# Patient Record
Sex: Male | Born: 1995 | Race: Black or African American | Hispanic: No | Marital: Single | Smoking: Never smoker
Health system: Southern US, Community
[De-identification: ages and names within clinical notes are randomized; demographics above are authoritative.]

---

## 2018-12-25 ENCOUNTER — Other Ambulatory Visit: Payer: Self-pay

## 2018-12-25 ENCOUNTER — Ambulatory Visit (INDEPENDENT_AMBULATORY_CARE_PROVIDER_SITE_OTHER): Payer: Self-pay

## 2018-12-25 ENCOUNTER — Ambulatory Visit (HOSPITAL_COMMUNITY)
Admission: EM | Admit: 2018-12-25 | Discharge: 2018-12-25 | Disposition: A | Discharge status: Home / Self Care | Payer: Self-pay | Attending: Family Medicine | Admitting: Family Medicine

## 2018-12-25 ENCOUNTER — Encounter (HOSPITAL_COMMUNITY): Payer: Self-pay

## 2018-12-25 DIAGNOSIS — S41111A Laceration without foreign body of right upper arm, initial encounter: Secondary | ICD-10-CM

## 2018-12-25 NOTE — ED Triage Notes (Signed)
Pt presents to UC w/ c/o right elbow and forearm laceration. Pt states he was exercising this morning and he hit some glass off the wall and cut his elbow at 0817 this morning.

## 2018-12-25 NOTE — ED Provider Notes (Addendum)
[place]    CSN: [id number] Arrival date & time: 12/25/18  0900      History   Chief Complaint Chief Complaint  Patient presents with  . right elbow laceration    HPI Joel Lozano is a 23 y.o. male.   Patient is a 23 year old male who presents today for right elbow laceration.  Reports that he was exercising this morning hitting glass that was on the wall.  Reporting that he cut his elbow approximately o'clock this morning.  Symptoms have been constant.  There is some slight bleeding.  Unsure if there is still glass in the wound.  Tetanus is not up-to-date per patient but he is refusing a tetanus.  No numbness, tingling or decreased range of motion of the extremity.  ROS per HPI      History reviewed. No pertinent past medical history.  There are no active problems to display for this patient.   History reviewed. No pertinent surgical history.     Home Medications    Prior to Admission medications   Not on File    Family History History reviewed. No pertinent family history.  Social History Social History   Tobacco Use  . Smoking status: Never Smoker  . Smokeless tobacco: Never Used  Substance Use Topics  . Alcohol use: Never    Frequency: Never  . Drug use: Never     Allergies   Patient has no known allergies.   Review of Systems Review of Systems   Physical Exam Triage Vital Signs ED Triage Vitals  Enc Vitals Group     BP 12/25/18 0920 130/82     Pulse Rate 12/25/18 0920 68     Resp 12/25/18 0920 16     Temp 12/25/18 0920 98.1 F (36.7 C)     Temp Source 12/25/18 0920 Temporal     SpO2 12/25/18 0920 99 %     Weight --      Height --      Head Circumference --      Peak Flow --      Pain Score 12/25/18 0933 0     Pain Loc --      Pain Edu? --      Excl. in [place]? --    No data found.  Updated Vital Signs BP 130/82 (BP Location: Left Arm)   Pulse 68   Temp 98.1 F (36.7 C) (Temporal)   Resp 16   SpO2 99%   Visual Acuity Right Eye Distance:   Left Eye Distance:   Bilateral Distance:    Right Eye Near:   Left Eye Near:    Bilateral Near:     Physical Exam Vitals signs and nursing note reviewed.  Constitutional:      Appearance: Normal appearance.  HENT:     Head: Normocephalic and atraumatic.  Eyes:     Conjunctiva/sclera: Conjunctivae normal.  Neck:     Musculoskeletal: Normal range of motion.  Pulmonary:     Effort: Pulmonary effort is normal.  Musculoskeletal: Normal range of motion.  Skin:    General: Skin is warm and dry.     Comments: See picture   Neurological:     Mental Status: He is alert.  Psychiatric:     Comments: Very flat affect but peasant.         UC Treatments / Results  Labs (all labs ordered are listed, but only abnormal results are displayed) Labs Reviewed - No data to display  EKG   Radiology Dg Elbow Complete Right  Result Date: 12/25/2018 CLINICAL DATA:  Pain following fall EXAM: RIGHT ELBOW - COMPLETE 3+ VIEW COMPARISON:  None. FINDINGS: Frontal, lateral, and bilateral oblique views were obtained. There are soft tissue injuries overlying the proximal forearm with air in these areas. There is no acute fracture or dislocation. No joint effusion. Joint spaces appear unremarkable. No erosive change. IMPRESSION: Areas of soft tissue injury with air in these areas. No radiopaque foreign body. No fracture or dislocation. No appreciable arthropathic change. Electronically Signed   By: Bretta Bang III M.D.   On: 12/25/2018 10:10    Procedures Laceration Repair  Date/Time: 12/25/2018 11:11 AM Performed by: Janace Aris, NP Authorized by: Janace Aris, NP   Consent:    Consent obtained:  Verbal   Consent given by:  Patient   Risks discussed:  Infection, need for additional repair, pain, poor cosmetic result and poor wound healing   Alternatives discussed:  No treatment and delayed treatment Universal protocol:    Patient identity  confirmed:  Verbally with patient Anesthesia (see MAR for exact dosages):    Anesthesia method:  Local infiltration   Local anesthetic:  Lidocaine 2% WITH epi Laceration details:    Location:  Shoulder/arm   Shoulder/arm location:  R elbow Repair type:    Repair type:  Complex Pre-procedure details:    Preparation:  Patient was prepped and draped in usual sterile fashion Exploration:    Limited defect created (wound extended): no     Hemostasis achieved with:  Direct pressure and epinephrine   Wound exploration: wound explored through full range of motion     Contaminated: no   Treatment:    Area cleansed with:  Saline   Amount of cleaning:  Standard   Visualized foreign bodies/material removed: no     Debridement:  None   Undermining:  None   Scar revision: no   Skin repair:    Repair method:  Sutures   Suture size:  4-0   Suture material:  Nylon   Suture technique:  Simple interrupted   Number of sutures:  8 Approximation:    Approximation:  Close Post-procedure details:    Dressing:  Open (no dressing)   Patient tolerance of procedure:  Tolerated well, no immediate complications   (including critical care time)  Medications Ordered in UC Medications - No data to display  Initial Impression / Assessment and Plan / UC Course  I have reviewed the triage vital signs and the nursing notes.  Pertinent labs & imaging results that were available during my care of the patient were reviewed by me and considered in my medical decision making (see chart for details).     2 lacerations repaired to right elbow and forearm. Patient tolerated well. Clean well before suturing and placed bacitracin on wound post suture. Recommended follow-up in 10 to 14 days for removal Infection precautions given. Pt understanding and agreed  Final Clinical Impressions(s) / UC Diagnoses   Final diagnoses:  Laceration of right upper extremity, initial encounter     Discharge Instructions      Nice to meet you  Keep the area clean and dry.  Watch for signs of infection Come back in 10 to 14 days for suture removal.      ED Prescriptions    None     PDMP not reviewed this encounter.   Janace Aris, NP 12/25/18 1110    Janace Aris, NP  12/25/18 1113  

## 2018-12-25 NOTE — Discharge Instructions (Addendum)
Nice to meet you  Keep the area clean and dry.  Watch for signs of infection Come back in 10 to 14 days for suture removal.

## 2019-01-06 ENCOUNTER — Ambulatory Visit (INDEPENDENT_AMBULATORY_CARE_PROVIDER_SITE_OTHER): Payer: Self-pay | Admitting: Primary Care

## 2020-03-19 IMAGING — DX DG ELBOW COMPLETE 3+V*R*
4 series · 4 of 4 positions shown · non-contrast
Comparison: None.

CLINICAL DATA: Pain following fall

EXAM:
RIGHT ELBOW - COMPLETE 3+ VIEW

[elbow ap]
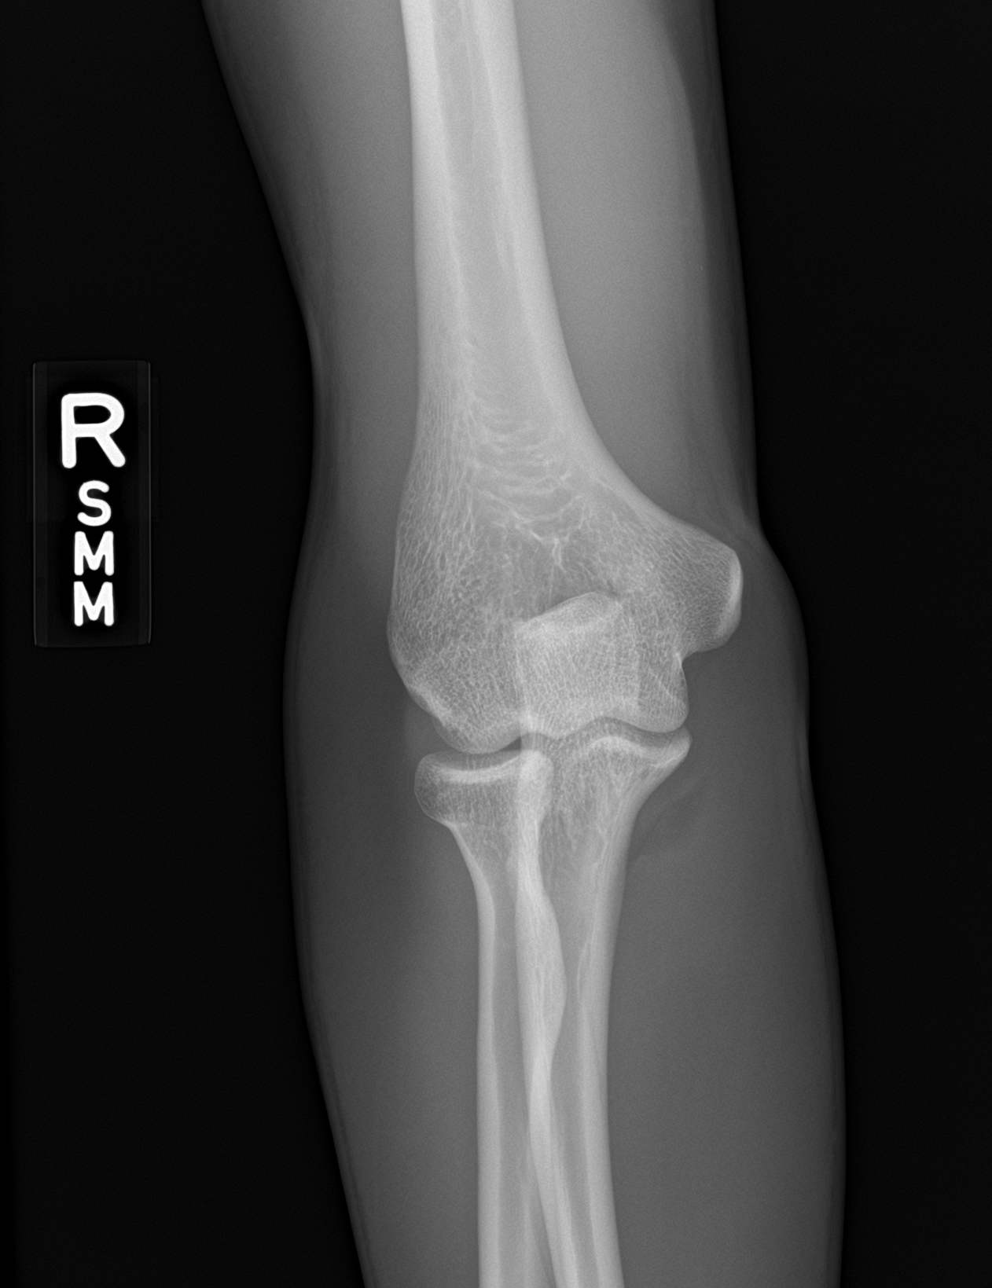

[elbow obl (1 of 2)]
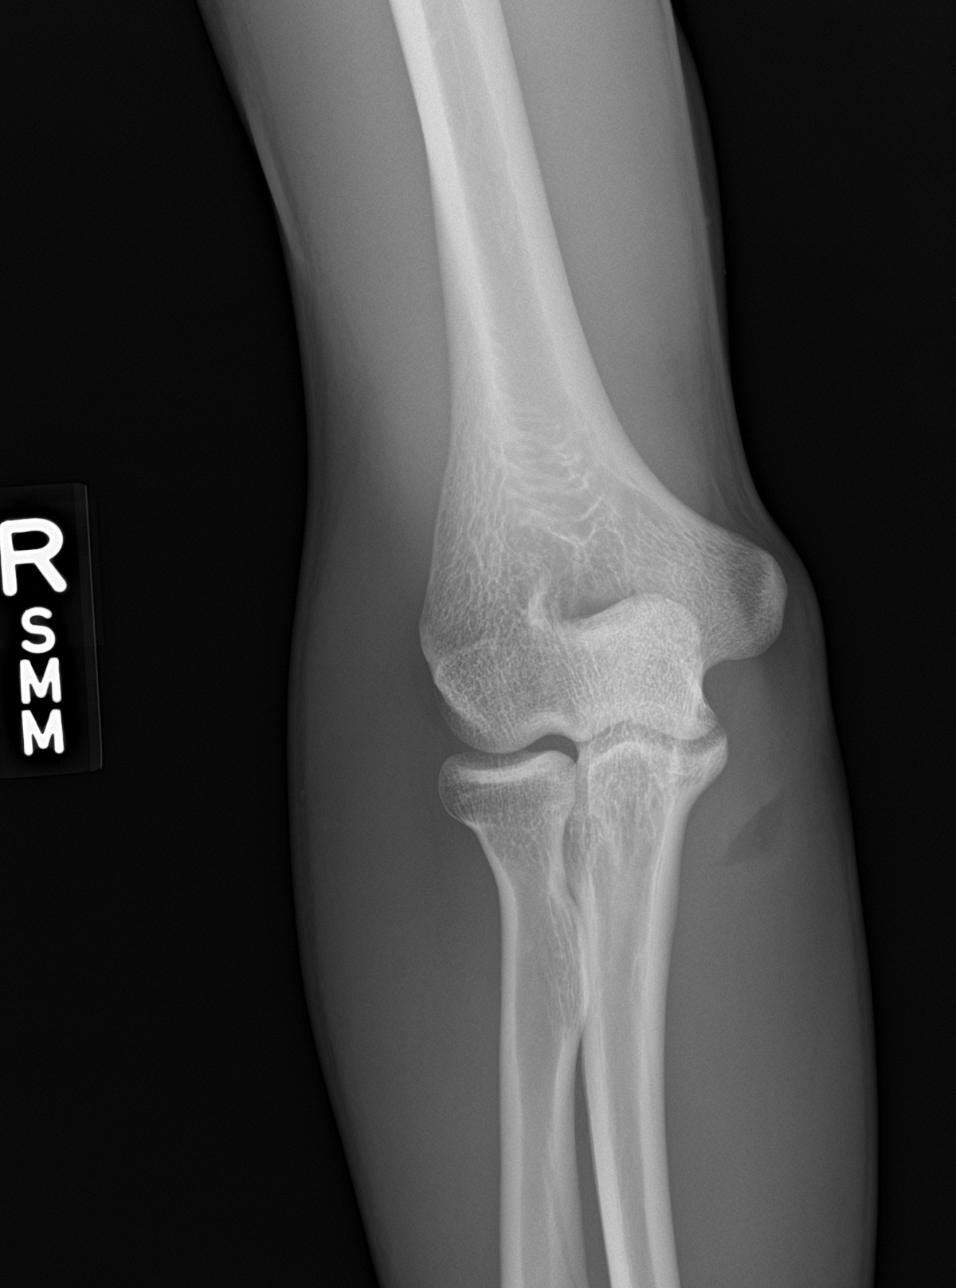

[elbow obl (2 of 2)]
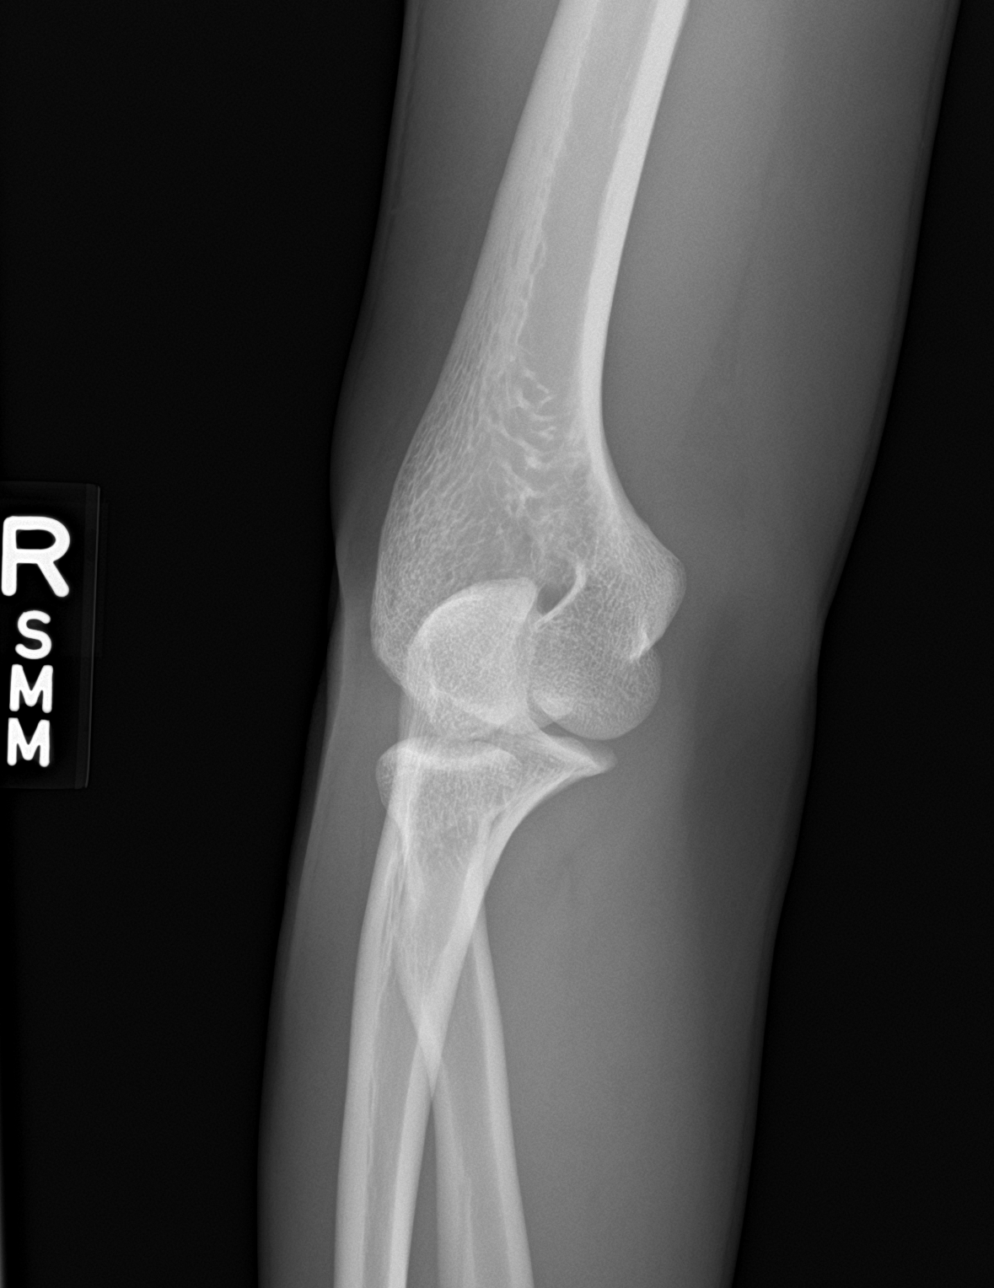

[elbow lat]
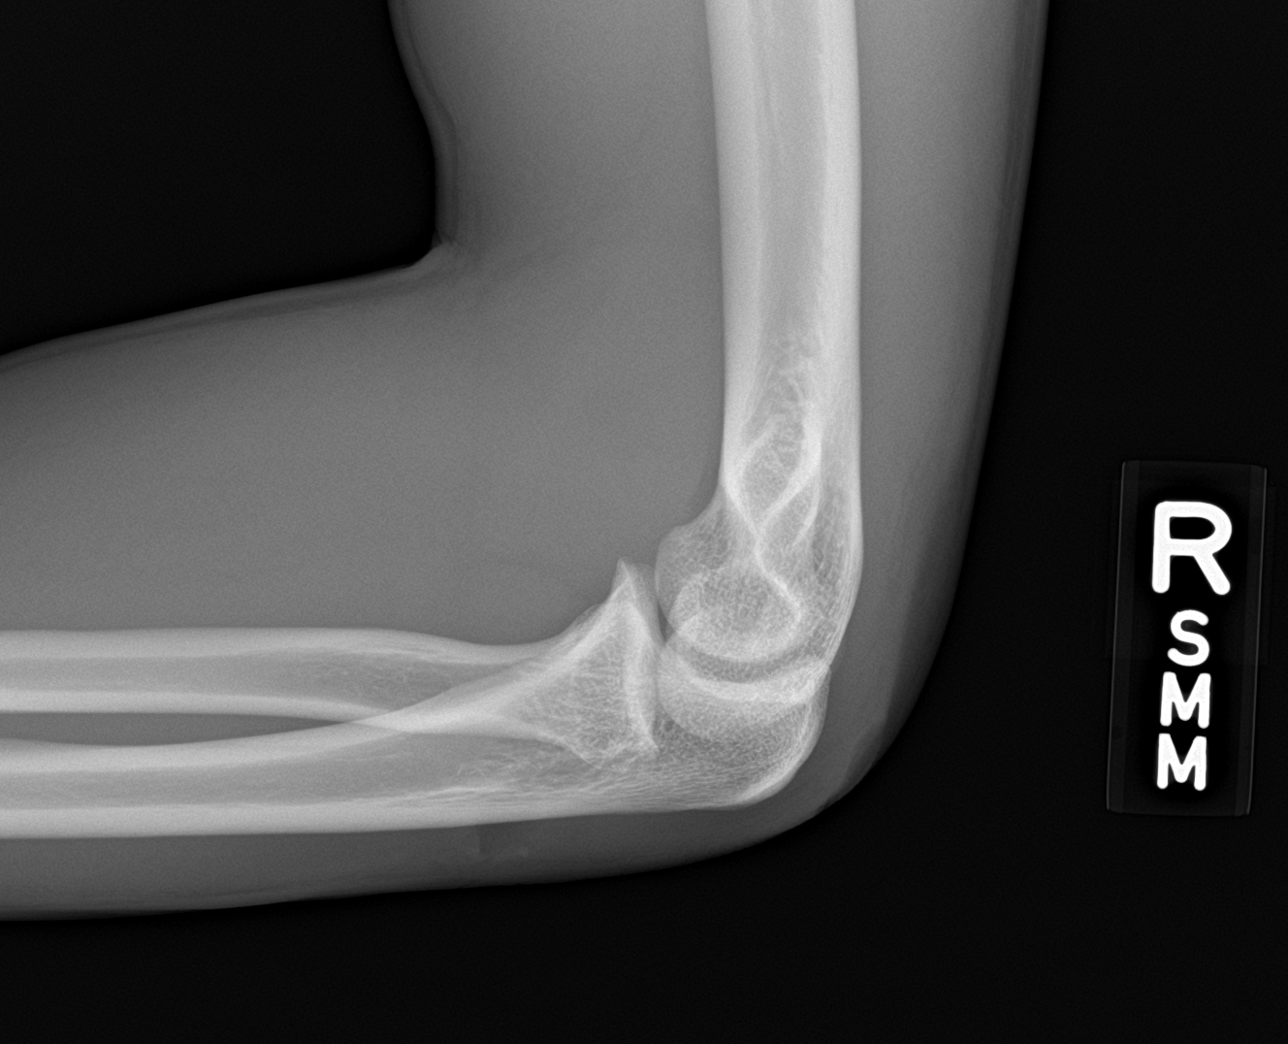

[4 of 4 positions shown; findings below may reference images not displayed]

FINDINGS: Frontal, lateral, and bilateral oblique views were obtained. There
are soft tissue injuries overlying the proximal forearm with air in
these areas. There is no acute fracture or dislocation. No joint
effusion. Joint spaces appear unremarkable. No erosive change.
IMPRESSION: Areas of soft tissue injury with air in these areas. No radiopaque
foreign body. No fracture or dislocation. No appreciable
arthropathic change.
